# Patient Record
Sex: Male | Born: 1995 | Race: Black or African American | Hispanic: No | Marital: Single | Smoking: Never smoker
Health system: Southern US, Community
[De-identification: ages and names within clinical notes are randomized; demographics above are authoritative.]

---

## 2015-06-24 ENCOUNTER — Encounter (HOSPITAL_COMMUNITY): Payer: Self-pay | Admitting: Emergency Medicine

## 2015-06-24 ENCOUNTER — Emergency Department (HOSPITAL_COMMUNITY)
Admission: EM | Admit: 2015-06-24 | Discharge: 2015-06-25 | Payer: Medicaid Other | Attending: Emergency Medicine | Admitting: Emergency Medicine

## 2015-06-24 ENCOUNTER — Emergency Department (HOSPITAL_COMMUNITY): Payer: Medicaid Other

## 2015-06-24 DIAGNOSIS — S60211A Contusion of right wrist, initial encounter: Secondary | ICD-10-CM | POA: Insufficient documentation

## 2015-06-24 DIAGNOSIS — Y9389 Activity, other specified: Secondary | ICD-10-CM | POA: Insufficient documentation

## 2015-06-24 DIAGNOSIS — S6991XA Unspecified injury of right wrist, hand and finger(s), initial encounter: Secondary | ICD-10-CM | POA: Diagnosis present

## 2015-06-24 DIAGNOSIS — S60811A Abrasion of right wrist, initial encounter: Secondary | ICD-10-CM | POA: Insufficient documentation

## 2015-06-24 DIAGNOSIS — S60511A Abrasion of right hand, initial encounter: Secondary | ICD-10-CM

## 2015-06-24 DIAGNOSIS — R52 Pain, unspecified: Secondary | ICD-10-CM

## 2015-06-24 DIAGNOSIS — Y9289 Other specified places as the place of occurrence of the external cause: Secondary | ICD-10-CM | POA: Insufficient documentation

## 2015-06-24 DIAGNOSIS — Y998 Other external cause status: Secondary | ICD-10-CM | POA: Insufficient documentation

## 2015-06-24 DIAGNOSIS — S5011XA Contusion of right forearm, initial encounter: Secondary | ICD-10-CM | POA: Insufficient documentation

## 2015-06-24 MED ORDER — TETANUS-DIPHTH-ACELL PERTUSSIS 5-2.5-18.5 LF-MCG/0.5 IM SUSP: 0.5000 mL | Freq: Once | INTRAMUSCULAR | Status: DC
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
## 2015-06-25 MED ORDER — AMOXICILLIN-POT CLAVULANATE 875-125 MG PO TABS
1.0000 | ORAL_TABLET | Freq: Once | ORAL | Status: AC
  Administered 2015-06-25: 1 via ORAL
  Filled 2015-06-25: qty 1

## 2015-06-25 MED ORDER — AMOXICILLIN-POT CLAVULANATE 875-125 MG PO TABS: 1.0000 | ORAL_TABLET | Freq: Two times a day (BID) | ORAL | Status: AC

## 2015-06-25 NOTE — Discharge Instructions (Signed)
1. Medications: alternate naprosyn and tylenol for pain control, augmentin, usual home medications 2. Treatment: rest, ice, elevate and use brace, drink plenty of fluids, gentle stretching 3. Follow Up: Please followup with orthopedics as directed or your PCP in 1 week if no improvement for discussion of your diagnoses and further evaluation after today's visit; if you do not have a primary care doctor use the resource guide provided to find one; Please return to the ER for worsening symptoms or other concerns   Abrasion An abrasion is a cut or scrape on the outer surface of your skin. An abrasion does not extend through all of the layers of your skin. It is important to care for your abrasion properly to prevent infection. CAUSES Most abrasions are caused by falling on or gliding across the ground or another surface. When your skin rubs on something, the outer and inner layer of skin rubs off.  SYMPTOMS A cut or scrape is the main symptom of this condition. The scrape may be bleeding, or it may appear red or pink. If there was an associated fall, there may be an underlying bruise. DIAGNOSIS An abrasion is diagnosed with a physical exam. TREATMENT Treatment for this condition depends on how large and deep the abrasion is. Usually, your abrasion will be cleaned with water and mild soap. This removes any dirt or debris that may be stuck. An antibiotic ointment may be applied to the abrasion to help prevent infection. A bandage (dressing) may be placed on the abrasion to keep it clean. You may also need a tetanus shot. HOME CARE INSTRUCTIONS Medicines  Take or apply medicines only as directed by your health care provider.  If you were prescribed an antibiotic ointment, finish all of it even if you start to feel better. Wound Care  Clean the wound with mild soap and water 2-3 times per day or as directed by your health care provider. Pat your wound dry with a clean towel. Do not rub  it.  There are many different ways to close and cover a wound. Follow instructions from your health care provider about:  Wound care.  Dressing changes and removal.  Check your wound every day for signs of infection. Watch for:  Redness, swelling, or pain.  Fluid, blood, or pus. General Instructions  Keep the dressing dry as directed by your health care provider. Do not take baths, swim, use a hot tub, or do anything that would put your wound underwater until your health care provider approves.  If there is swelling, raise (elevate) the injured area above the level of your heart while you are sitting or lying down.  Keep all follow-up visits as directed by your health care provider. This is important. SEEK MEDICAL CARE IF:  You received a tetanus shot and you have swelling, severe pain, redness, or bleeding at the injection site.  Your pain is not controlled with medicine.  You have increased redness, swelling, or pain at the site of your wound. SEEK IMMEDIATE MEDICAL CARE IF:  You have a red streak going away from your wound.  You have a fever.  You have fluid, blood, or pus coming from your wound.  You notice a bad smell coming from your wound or your dressing.   This information is not intended to replace advice given to you by your health care provider. Make sure you discuss any questions you have with your health care provider.   Document Released: 12/06/2004 Document Revised: 11/17/2014 Document Reviewed:  02/24/2014 Elsevier Interactive Patient Education [employer]2016 [employer]
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
